# Patient Record
Sex: Female | Born: 2005 | Race: White | Hispanic: No | Marital: Single | Smoking: Never smoker
Health system: Southern US, Community
[De-identification: ages and names within clinical notes are randomized; demographics above are authoritative.]

## PROBLEM LIST (undated history)

## (undated) DIAGNOSIS — T7840XA Allergy, unspecified, initial encounter: Secondary | ICD-10-CM

## (undated) DIAGNOSIS — L309 Dermatitis, unspecified: Secondary | ICD-10-CM

## (undated) HISTORY — DX: Allergy, unspecified, initial encounter: T78.40XA

## (undated) HISTORY — DX: Dermatitis, unspecified: L30.9

## (undated) HISTORY — PX: OTHER SURGICAL HISTORY: SHX169

---
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
## 2024-01-07 ENCOUNTER — Encounter: Payer: Self-pay | Admitting: Family

## 2024-01-07 ENCOUNTER — Ambulatory Visit (INDEPENDENT_AMBULATORY_CARE_PROVIDER_SITE_OTHER): Payer: No Typology Code available for payment source | Admitting: Family

## 2024-01-07 VITALS — BP 100/60 | HR 88 | Temp 98.3°F | Resp 16 | Ht 62.74 in | Wt 101.4 lb

## 2024-01-07 DIAGNOSIS — Z23 Encounter for immunization: Secondary | ICD-10-CM | POA: Diagnosis not present

## 2024-01-07 DIAGNOSIS — Z0000 Encounter for general adult medical examination without abnormal findings: Secondary | ICD-10-CM

## 2024-01-07 NOTE — Patient Instructions (Signed)
 VISIT SUMMARY:  Today, you came in for your annual physical exam. We discussed your immunization status, eczema, bursitis, headaches, lightheadedness, and irregular menstrual cycles. We also talked about your general health maintenance as you prepare for college.  YOUR PLAN:  IMMUNIZATION STATUS: You are working on [employer] records for school requirements. -Administer MenB vaccine due to increased meningitis risk in dormitory settings. -Ensure your immunization records are complete and updated.  ECZEMA: You experience eczema flare-ups approximately every two weeks. -Continue using your prescribed ointments for management.  BURSITIS: You have bursitis in both hips, with pain worsened by carrying a heavy backpack. -Continue performing nightly stretches to alleviate back pain.  HEADACHES AND LIGHTHEADEDNESS: You frequently experience headaches and lightheadedness, likely due to inadequate nutrition and hydration. -Encourage a balanced diet with three meals and two snacks daily.  IRREGULAR MENSTRUAL CYCLES: Your menstrual cycles are irregular, occurring approximately every 80 days. -Continue increased physical activity to help regulate your cycles.  GENERAL HEALTH MAINTENANCE: You are up to date on most immunizations, not sexually active, and have no substance use. You have a recent eye prescription update and are current on dental care. -Maintain regular exercise. -Schedule a follow-up next summer.

## 2024-01-07 NOTE — Addendum Note (Signed)
 Addended by: ELOUISE POWELL HERO on: 01/07/2024 01:50 PM   Modules accepted: Orders

## 2024-01-07 NOTE — Progress Notes (Signed)
 Subjective:     Patient ID: Helen Caldwell, female    DOB: August 18, 2005, 18 y.o.   MRN: [id number]  Chief Complaint  Patient presents with   Annual Exam    Pt states not fasting     HPI  Discussed the use of AI scribe software for clinical note transcription with the patient, who gave verbal consent to proceed.  History of Present Illness  Helen Caldwell is a 18 year old female who presents for an annual physical exam.  She experiences eczema flare-ups approximately every two weeks and uses prescribed ointments for management. She has bursitis in both hips, with pain worsened by carrying a heavy backpack, and performs nightly stretches to alleviate back pain.  She frequently experiences headaches and lightheadedness, which she attributes to inadequate nutrition and hydration due to inconsistent work hours. Her menstrual cycles are irregular, occurring approximately every 80 days, with increased physical activity helping to regulate them.  She has no current cough, cold symptoms, diarrhea, constipation, urinary issues, or unusual muscle or joint pain outside of her known bursitis. She has current concerns about anxiety. She is up to date on dental and eye exams, with a recent update to her eyeglass prescription.  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 Vision: up to date Dental: up to date      Health Maintenance Due  Topic Date Due   HIV Screening  Never done   Meningococcal B Vaccine (1 of 2 - Standard) Never done   Hepatitis C Screening  Never done   COVID-19 Vaccine (5 - 2024-25 season) 03/02/2023    Past Medical History:  Diagnosis Date   Allergy    Eczema     Past Surgical History:  Procedure Laterality Date   no past surgery      Family History  Problem Relation Age of Onset   Breast cancer Maternal Grandfather 45   Arthritis Maternal Grandfather    Food Allergy Paternal Grandmother    Alcohol abuse Paternal  Grandmother        died from cirrhosis   Prostate cancer Paternal Grandfather    Diabetes Mellitus II Paternal Grandfather    Diabetes Other    Allergic rhinitis Neg Hx    Angioedema Neg Hx    Eczema Neg Hx    Asthma Neg Hx    Urticaria Neg Hx    Immunodeficiency Neg Hx     Social History   Socioeconomic History   Marital status: Single    Spouse name: Not on file   Number of children: Not on file   Years of education: Not on file   Highest education level: Not on file  Occupational History   Not on file  Tobacco Use   Smoking status: Never   Smokeless tobacco: Never  Vaping Use   Vaping status: Never Used  Substance and Sexual Activity   Alcohol use: Never   Drug use: Never   Sexual activity: Not Currently    Partners: Male  Other Topics Concern   Not on file  Social History Narrative   Senior at [employer]- plans to attend [employer] in [place]  beach to study history   Enjoys drawing/art   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   Social Drivers of Corporate investment banker Strain: Not on file  Food Insecurity: Not on file  Transportation Needs: Not on file  Physical Activity: Insufficiently Active (  01/27/2023)   Exercise Vital Sign    Days of Exercise per Week: 2 days    Minutes of Exercise per Session: 30 min  Stress: Not on file  Social Connections: Not on file  Intimate Partner Violence: Not on file    Outpatient Medications Prior to Visit  Medication Sig Dispense Refill   EPINEPHrine  (AUVI-Q ) 0.3 mg/0.3 mL IJ SOAJ injection Inject 0.3 mg into the muscle as needed for anaphylaxis. 2 each 1   mometasone (ELOCON) 0.1 % cream Apply topically 2 (two) times daily.     montelukast  (SINGULAIR ) 10 MG tablet TAKE 1 TABLET BY MOUTH AT  BEDTIME 30 tablet 0   clobetasol  ointment (TEMOVATE ) 0.05 % Apply topically twice daily to severe areas of red, thickened, sandpapery rash for up to 10 days at a time.  Do not use on face, armpits,  groin. (Patient not taking: Reported on 01/07/2024) 30 g 1   No facility-administered medications prior to visit.    Allergies  Allergen Reactions   Cashew Nut (Anacardium Occidentale) Skin Test     SPT and history positive 04/06/21   Pistachio Nut (Diagnostic)     SPT and history positive 04/06/21    Review of Systems  Constitutional:  Negative for weight loss.  HENT:  Negative for congestion and hearing loss.   Eyes:  Negative for blurred vision.  Respiratory:  Negative for cough.   Cardiovascular:  Negative for leg swelling.  Gastrointestinal:  Negative for constipation and diarrhea.  Genitourinary:  Negative for dysuria and frequency.  Musculoskeletal:  Positive for joint pain (occasional bursitis flare ups in hips). Negative for myalgias.  Skin:  Positive for rash.       Occasional eczema  Neurological:  Positive for headaches (feels that she needs to drink more water).  Psychiatric/Behavioral:  Negative for depression. The patient is not nervous/anxious.        Objective:    Physical Exam  Physical Exam  Constitutional: She is oriented to person, place, and time. She appears well-developed and well-nourished. No distress.  HENT:  Head: Normocephalic and atraumatic.  Right Ear: Tympanic membrane and ear canal normal.  Left Ear: Tympanic membrane and ear canal normal.  Mouth/Throat: Oropharynx is clear and moist.  Eyes: Pupils are equal, round, and reactive to light. No scleral icterus.  Neck: Normal range of motion. No thyromegaly present.  Cardiovascular: Normal rate and regular rhythm.   No murmur heard. Pulmonary/Chest: Effort normal and breath sounds normal. No respiratory distress. He has no wheezes. She has no rales. She exhibits no tenderness.  Abdominal: Soft. Bowel sounds are normal. She exhibits no distension and no mass. There is no tenderness. There is no rebound and no guarding.  Musculoskeletal: She exhibits no edema.  Lymphadenopathy:    She has no  cervical adenopathy.  Neurological: She is alert and oriented to person, place, and time. She has normal patellar reflexes. She exhibits normal muscle tone. Coordination normal.  Skin: Skin is warm and dry.  Psychiatric: She has a normal mood and affect. Her behavior is normal. Judgment and thought content normal.  Breast/Pelvic: deferred           Assessment & Plan:    BP 100/60   Pulse 88   Temp 98.3 F (36.8 C) (Oral)   Resp 16   Ht 5' 2.74 (1.594 m)   Wt 101 lb 6.4 oz (46 kg)   SpO2 100%   BMI 18.11 kg/m  Wt Readings from Last 3 Encounters:  01/07/24 101 lb 6.4 oz (46 kg) (5%, Z= -1.64)*  09/15/23 97 lb 9.6 oz (44.3 kg) (3%, Z= -1.96)*  01/27/23 100 lb (45.4 kg) (5%, Z= -1.65)*   * Growth percentiles are based on CDC (Girls, 2-20 Years) data.       Assessment & Plan:   Problem List Items Addressed This Visit       Unprioritized   Encounter for screening and preventative care - Primary    18 year old female preparing for college. Up to date on immunizations except MenB.  History of eczema, bursitis, and irregular menstrual cycles. Not sexually active, no substance use. Recent eye prescription update and current on dental care. Reports healthy diet and regular exercise but inconsistent eating due to work, causing headaches and lightheadedness likely from inadequate nutrition and hydration. Discussed and consented to MenB vaccine due to increased meningitis risk in dormitory settings. - Encourage a balanced diet with three meals and two snacks daily. - Advise maintaining regular exercise. - Schedule follow-up next summer.        I have discontinued Helen Caldwell's clobetasol  ointment. I am also having her maintain her mometasone, EPINEPHrine , and montelukast .  No orders of the defined types were placed in this encounter.

## 2024-01-07 NOTE — Assessment & Plan Note (Signed)
  18 year old female preparing for college. Up to date on immunizations except MenB.  History of eczema, bursitis, and irregular menstrual cycles. Not sexually active, no substance use. Recent eye prescription update and current on dental care. Reports healthy diet and regular exercise but inconsistent eating due to work, causing headaches and lightheadedness likely from inadequate nutrition and hydration. Discussed and consented to MenB vaccine due to increased meningitis risk in dormitory settings. - Encourage a balanced diet with three meals and two snacks daily. - Advise maintaining regular exercise. - Schedule follow-up next summer.
